# Patient Record
Sex: Female | Born: 1970 | Race: Black or African American | Hispanic: No | Marital: Married | Smoking: Current every day smoker
Health system: Southern US, Community
[De-identification: ages and names within clinical notes are randomized; demographics above are authoritative.]

## PROBLEM LIST (undated history)

## (undated) DIAGNOSIS — W3400XA Accidental discharge from unspecified firearms or gun, initial encounter: Secondary | ICD-10-CM

## (undated) HISTORY — PX: OTHER SURGICAL HISTORY: SHX169

---

## 2015-05-11 ENCOUNTER — Emergency Department (HOSPITAL_BASED_OUTPATIENT_CLINIC_OR_DEPARTMENT_OTHER)
Admission: EM | Admit: 2015-05-11 | Discharge: 2015-05-11 | Disposition: A | Discharge status: Home / Self Care | Payer: Self-pay | Attending: Emergency Medicine | Admitting: Emergency Medicine

## 2015-05-11 ENCOUNTER — Emergency Department (HOSPITAL_BASED_OUTPATIENT_CLINIC_OR_DEPARTMENT_OTHER): Payer: Self-pay

## 2015-05-11 ENCOUNTER — Encounter (HOSPITAL_BASED_OUTPATIENT_CLINIC_OR_DEPARTMENT_OTHER): Payer: Self-pay

## 2015-05-11 DIAGNOSIS — S8992XA Unspecified injury of left lower leg, initial encounter: Secondary | ICD-10-CM | POA: Insufficient documentation

## 2015-05-11 DIAGNOSIS — Y9389 Activity, other specified: Secondary | ICD-10-CM | POA: Insufficient documentation

## 2015-05-11 DIAGNOSIS — F17210 Nicotine dependence, cigarettes, uncomplicated: Secondary | ICD-10-CM | POA: Insufficient documentation

## 2015-05-11 DIAGNOSIS — Y9289 Other specified places as the place of occurrence of the external cause: Secondary | ICD-10-CM | POA: Insufficient documentation

## 2015-05-11 DIAGNOSIS — G8929 Other chronic pain: Secondary | ICD-10-CM | POA: Insufficient documentation

## 2015-05-11 DIAGNOSIS — X58XXXA Exposure to other specified factors, initial encounter: Secondary | ICD-10-CM | POA: Insufficient documentation

## 2015-05-11 DIAGNOSIS — Y998 Other external cause status: Secondary | ICD-10-CM | POA: Insufficient documentation

## 2015-05-11 DIAGNOSIS — M549 Dorsalgia, unspecified: Secondary | ICD-10-CM

## 2015-05-11 DIAGNOSIS — M5442 Lumbago with sciatica, left side: Secondary | ICD-10-CM | POA: Insufficient documentation

## 2015-05-11 HISTORY — DX: Accidental discharge from unspecified firearms or gun, initial encounter: W34.00XA

## 2015-05-11 MED ORDER — DIAZEPAM 5 MG PO TABS: 2.5000 mg | ORAL_TABLET | Freq: Two times a day (BID) | ORAL | Status: DC

## 2015-05-11 MED ORDER — ACETAMINOPHEN 500 MG PO TABS
1000.0000 mg | ORAL_TABLET | Freq: Once | ORAL | Status: AC
  Administered 2015-05-11: 1000 mg via ORAL
  Filled 2015-05-11: qty 2

## 2015-05-11 MED ORDER — DIAZEPAM 2 MG PO TABS
2.0000 mg | ORAL_TABLET | Freq: Once | ORAL | Status: AC
  Administered 2015-05-11: 2 mg via ORAL
  Filled 2015-05-11: qty 1

## 2015-05-11 MED ORDER — OXYCODONE HCL 5 MG PO TABS
5.0000 mg | ORAL_TABLET | Freq: Once | ORAL | Status: AC
  Administered 2015-05-11: 5 mg via ORAL
  Filled 2015-05-11: qty 1

## 2015-05-11 NOTE — ED Notes (Signed)
Pt has family awaiting to drive her home.
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
## 2015-05-11 NOTE — Discharge Instructions (Signed)
Take 4 over the counter ibuprofen tablets 3 times a day or 2 over-the-counter naproxen tablets twice a day for pain. ° °Back Pain, Adult °Back pain is very common in adults. The cause of back pain is rarely dangerous and the pain often gets better over time. The cause of your back pain may not be known. Some common causes of back pain include: °· Strain of the muscles or ligaments supporting the spine. °· Wear and tear (degeneration) of the spinal disks. °· Arthritis. °· Direct injury to the back. °For many people, back pain may return. Since back pain is rarely dangerous, most people can learn to manage this condition on their own. °HOME CARE INSTRUCTIONS °Watch your back pain for any changes. The following actions may help to lessen any discomfort you are feeling: °· Remain active. It is stressful on your back to sit or stand in one place for long periods of time. Do not sit, drive, or stand in one place for more than 30 minutes at a time. Take short walks on even surfaces as soon as you are able. Try to increase the length of time you walk each day. °· Exercise regularly as directed by your health care provider. Exercise helps your back heal faster. It also helps avoid future injury by keeping your muscles strong and flexible. °· Do not stay in bed. Resting more than 1-2 days can delay your recovery. °· Pay attention to your body when you bend and lift. The most comfortable positions are those that put less stress on your recovering back. Always use proper lifting techniques, including: °¨ Bending your knees. °¨ Keeping the load close to your body. °¨ Avoiding twisting. °· Find a comfortable position to sleep. Use a firm mattress and lie on your side with your knees slightly bent. If you lie on your back, put a pillow under your knees. °· Avoid feeling anxious or stressed. Stress increases muscle tension and can worsen back pain. It is important to recognize when you are anxious or stressed and learn ways to  manage it, such as with exercise. °· Take medicines only as directed by your health care provider. Over-the-counter medicines to reduce pain and inflammation are often the most helpful. Your health care provider may prescribe muscle relaxant drugs. These medicines help dull your pain so you can more quickly return to your normal activities and healthy exercise. °· Apply ice to the injured area: °¨ Put ice in a plastic bag. °¨ Place a towel between your skin and the bag. °¨ Leave the ice on for 20 minutes, 2-3 times a day for the first 2-3 days. After that, ice and heat may be alternated to reduce pain and spasms. °· Maintain a healthy weight. Excess weight puts extra stress on your back and makes it difficult to maintain good posture. °SEEK MEDICAL CARE IF: °· You have pain that is not relieved with rest or medicine. °· You have increasing pain going down into the legs or buttocks. °· You have pain that does not improve in one week. °· You have night pain. °· You lose weight. °· You have a fever or chills. °SEEK IMMEDIATE MEDICAL CARE IF:  °· You develop new bowel or bladder control problems. °· You have unusual weakness or numbness in your arms or legs. °· You develop nausea or vomiting. °· You develop abdominal pain. °· You feel faint. °  °This information is not intended to replace advice given to you by your health care provider. Make sure you discuss any questions you have   with your health care provider. °  °Document Released: 05/30/2005 Document Revised: 06/20/2014 Document Reviewed: 10/01/2013 °Elsevier Interactive Patient Education ©2016 [employer] ° °

## 2015-05-11 NOTE — ED Notes (Signed)
Reports helped niece get off toilet since she just had a c section and felt a pop in her back.  Pt complains of lower back.  Reports numbness and tingling down legs at first but has resolved.  No bowel or bladder function loss.

## 2017-03-24 ENCOUNTER — Emergency Department (HOSPITAL_BASED_OUTPATIENT_CLINIC_OR_DEPARTMENT_OTHER): Payer: Self-pay

## 2017-03-24 ENCOUNTER — Encounter (HOSPITAL_BASED_OUTPATIENT_CLINIC_OR_DEPARTMENT_OTHER): Payer: Self-pay | Admitting: Emergency Medicine

## 2017-03-24 ENCOUNTER — Emergency Department (HOSPITAL_BASED_OUTPATIENT_CLINIC_OR_DEPARTMENT_OTHER)
Admission: EM | Admit: 2017-03-24 | Discharge: 2017-03-24 | Disposition: A | Discharge status: Home / Self Care | Payer: Self-pay | Attending: Emergency Medicine | Admitting: Emergency Medicine

## 2017-03-24 DIAGNOSIS — J069 Acute upper respiratory infection, unspecified: Secondary | ICD-10-CM | POA: Insufficient documentation

## 2017-03-24 DIAGNOSIS — R05 Cough: Secondary | ICD-10-CM | POA: Insufficient documentation

## 2017-03-24 DIAGNOSIS — R0789 Other chest pain: Secondary | ICD-10-CM | POA: Insufficient documentation

## 2017-03-24 DIAGNOSIS — F17210 Nicotine dependence, cigarettes, uncomplicated: Secondary | ICD-10-CM | POA: Insufficient documentation

## 2017-03-24 LAB — D-DIMER, QUANTITATIVE (NOT AT ARMC): D DIMER QUANT: 0.32 ug{FEU}/mL (ref 0.00–0.50)

## 2017-03-24 MED ORDER — NAPROXEN 375 MG PO TABS: 375.0000 mg | ORAL_TABLET | Freq: Two times a day (BID) | ORAL | 0 refills | Status: AC

## 2017-03-24 MED FILL — NAPROXEN 375 MG TABS: 375 | 10 days supply | Qty: 20 | Fill #0
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
## 2017-03-24 NOTE — ED Triage Notes (Signed)
Upper chest pain along with URI symptoms for 3 days.  Pt productive cough.

## 2018-06-30 IMAGING — CR DG CHEST 2V
2 series · 2 of 2 positions shown · non-contrast
Comparison: None.

CLINICAL DATA: Cough for the past 2 weeks. Chest pain and shortness
of breath recently. Smoker.

EXAM:
CHEST  2 VIEW

[w chest pa]
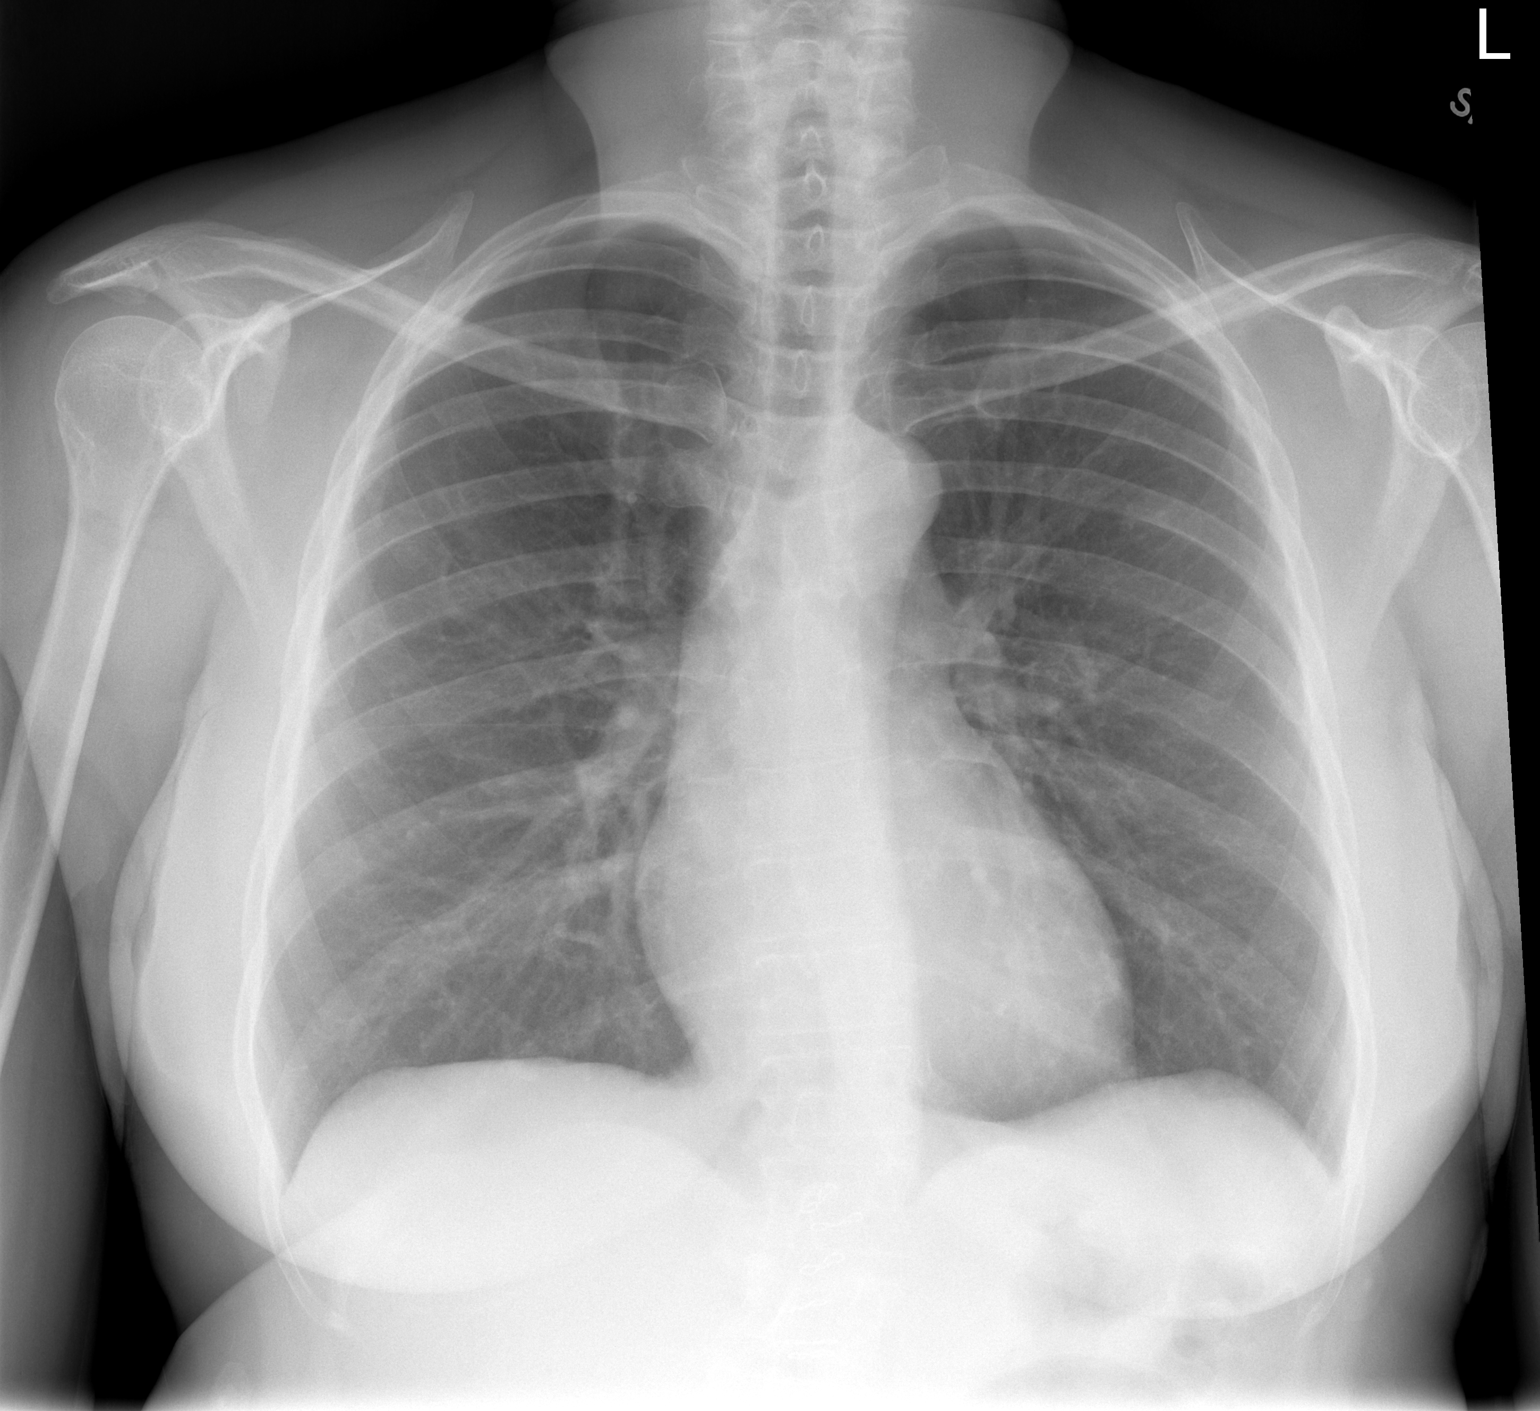

[w chest lat]
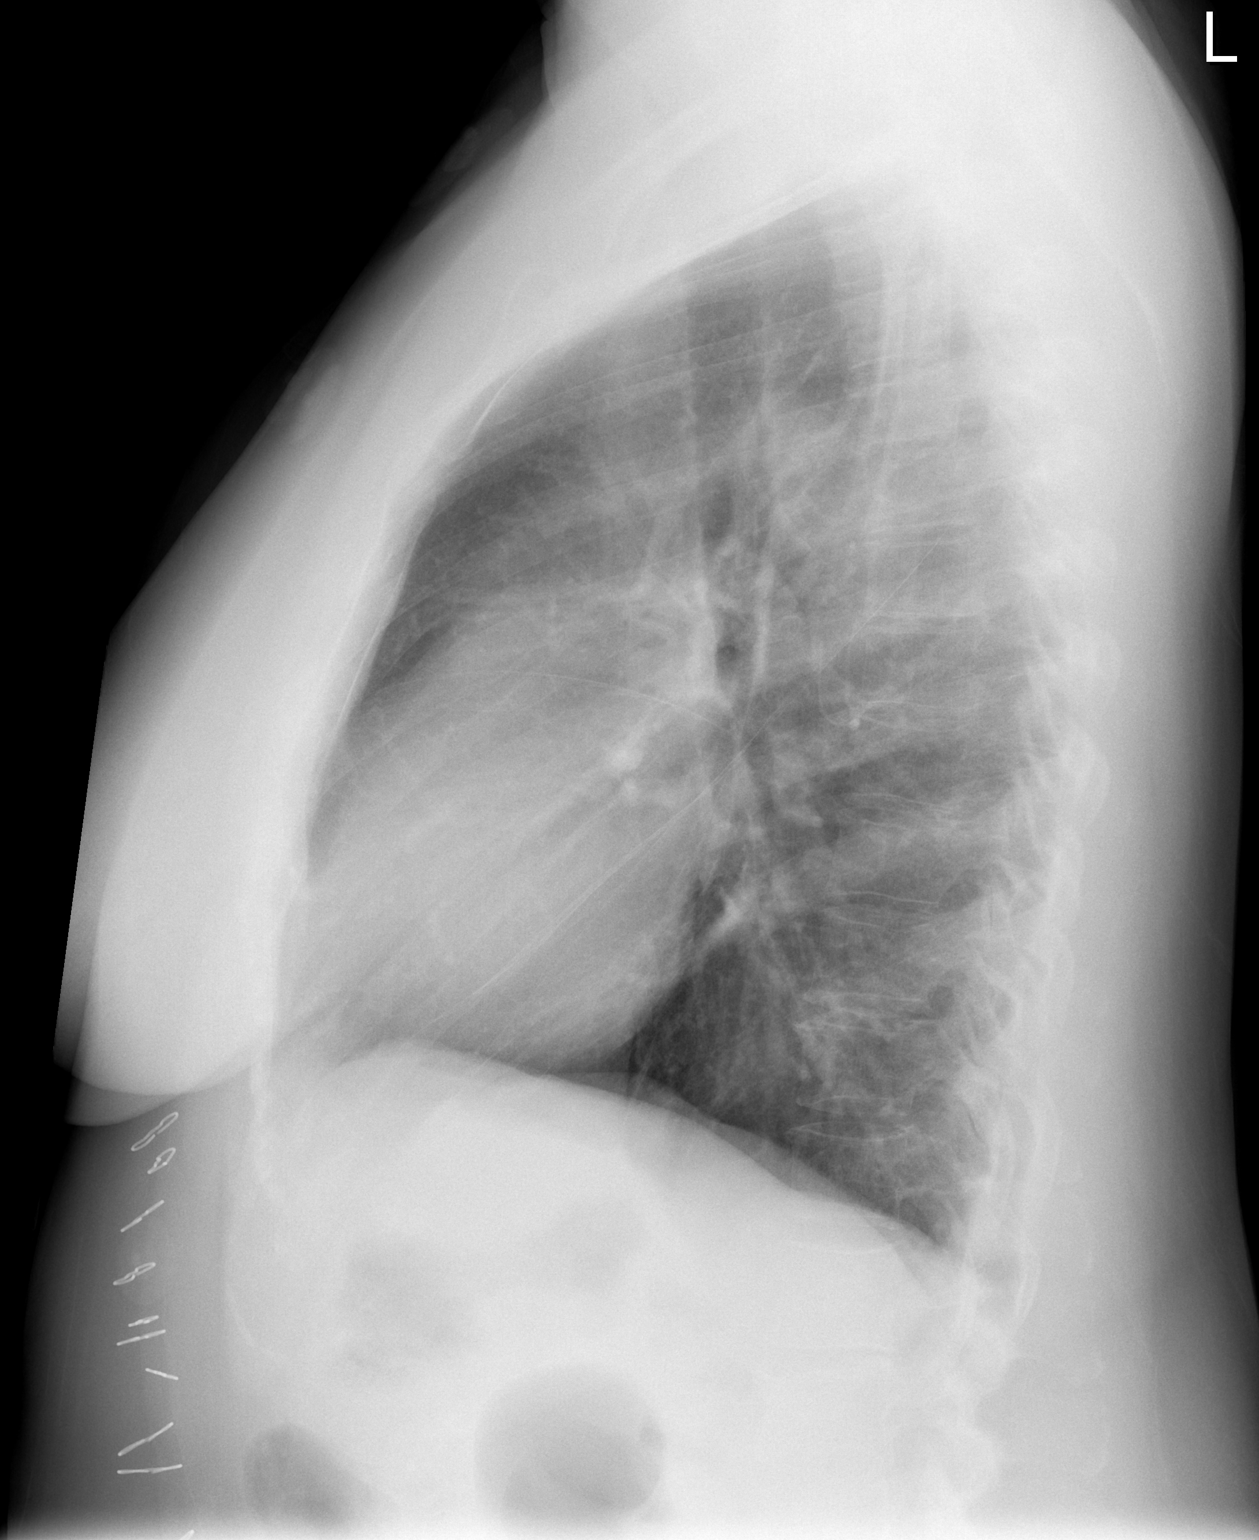

[2 of 2 positions shown; findings below may reference images not displayed]

FINDINGS: Normal sized heart. Clear lungs with normal vascularity. There is
mild hyperexpansion of the lungs with flattening of the
hemidiaphragms. Mild scoliosis.
IMPRESSION: 1. Mild changes of COPD.
2. No acute abnormality.
